# Patient Record
Sex: Male | Born: 1940 | Race: White | Hispanic: No | Marital: Married
Health system: Southern US, Community
[De-identification: ages and names within clinical notes are randomized; demographics above are authoritative.]

## PROBLEM LIST (undated history)

## (undated) DIAGNOSIS — I639 Cerebral infarction, unspecified: Secondary | ICD-10-CM

---

## 2020-04-25 ENCOUNTER — Encounter (HOSPITAL_COMMUNITY): Payer: Self-pay

## 2020-04-25 ENCOUNTER — Emergency Department (HOSPITAL_COMMUNITY): Payer: Medicare Other

## 2020-04-25 ENCOUNTER — Other Ambulatory Visit: Payer: Self-pay

## 2020-04-25 ENCOUNTER — Emergency Department (HOSPITAL_COMMUNITY)
Admission: EM | Admit: 2020-04-25 | Discharge: 2020-04-26 | Disposition: A | Discharge status: Home / Self Care | Payer: Medicare Other | Attending: Emergency Medicine | Admitting: Emergency Medicine

## 2020-04-25 DIAGNOSIS — Z8673 Personal history of transient ischemic attack (TIA), and cerebral infarction without residual deficits: Secondary | ICD-10-CM | POA: Insufficient documentation

## 2020-04-25 DIAGNOSIS — R0789 Other chest pain: Secondary | ICD-10-CM | POA: Insufficient documentation

## 2020-04-25 DIAGNOSIS — R600 Localized edema: Secondary | ICD-10-CM | POA: Diagnosis not present

## 2020-04-25 DIAGNOSIS — Z7901 Long term (current) use of anticoagulants: Secondary | ICD-10-CM | POA: Diagnosis not present

## 2020-04-25 DIAGNOSIS — R0781 Pleurodynia: Secondary | ICD-10-CM | POA: Diagnosis present

## 2020-04-25 DIAGNOSIS — R0602 Shortness of breath: Secondary | ICD-10-CM | POA: Diagnosis not present

## 2020-04-25 HISTORY — DX: Cerebral infarction, unspecified: I63.9

## 2020-04-25 LAB — CBC
HCT: 34.9 % — ABNORMAL LOW (ref 39.0–52.0)
Hemoglobin: 11.5 g/dL — ABNORMAL LOW (ref 13.0–17.0)
MCH: 33.1 pg (ref 26.0–34.0)
MCHC: 33 g/dL (ref 30.0–36.0)
MCV: 100.6 fL — ABNORMAL HIGH (ref 80.0–100.0)
Platelets: 223 10*3/uL (ref 150–400)
RBC: 3.47 MIL/uL — ABNORMAL LOW (ref 4.22–5.81)
RDW: 13.1 % (ref 11.5–15.5)
WBC: 12.8 10*3/uL — ABNORMAL HIGH (ref 4.0–10.5)
nRBC: 0 % (ref 0.0–0.2)

## 2020-04-25 LAB — BASIC METABOLIC PANEL
Anion gap: 8 (ref 5–15)
BUN: 12 mg/dL (ref 8–23)
CO2: 27 mmol/L (ref 22–32)
Calcium: 9.1 mg/dL (ref 8.9–10.3)
Chloride: 101 mmol/L (ref 98–111)
Creatinine, Ser: 0.91 mg/dL (ref 0.61–1.24)
GFR, Estimated: >60 mL/min (ref >60)
Glucose, Bld: 135 mg/dL — ABNORMAL HIGH (ref 70–99)
Potassium: 4.2 mmol/L (ref 3.5–5.1)
Sodium: 136 mmol/L (ref 135–145)

## 2020-04-25 LAB — TROPONIN I (HIGH SENSITIVITY)
Troponin I (High Sensitivity): 5 ng/L (ref <18)
Troponin I (High Sensitivity): 6 ng/L (ref <18)

## 2020-04-25 MED ORDER — IOHEXOL 350 MG/ML SOLN
78.0000 mL | Freq: Once | INTRAVENOUS | Status: AC | PRN
  Administered 2020-04-25: 78 mL via INTRAVENOUS

## 2020-04-25 NOTE — ED Provider Notes (Signed)
[facility] EMERGENCY DEPARTMENT Provider Note   CSN: [id number] Arrival date & time: 04/25/20  2010     History Chief Complaint  Patient presents with  . Chest Pain    Devin Oconnell is a 80 y.o. male history of atrial flutter on Eliquis, stroke with residual left-sided weakness, presenting to the emergency department with complaint of chest pain that began this morning.  He woke up this morning with central chest pain.  Pain is fine at rest though he has a pain across the front of his chest with any deep breathing.  He states he feels as though something is trying to "bust out" from his chest.  He is also feeling quite short of breath with any exertion though his wife admits he is not very ambulatory after his stroke. He didn't ambulate much yesterday so isn't able to tell if he was SOB yesterday.  He has been doing physical therapy, has been doing a hand bike recently.  He did his hand bike today and this did not cause any worsening of his symptoms.  It is not worse when he moves his arms, only with deep breathing.  He has not missed any doses of Eliquis.   The history is provided by the patient and the spouse.       Past Medical History:  Diagnosis Date  . Stroke [facility])     There are no problems to display for this patient.   History reviewed. No pertinent surgical history.     History reviewed. No pertinent family history.     Home Medications Prior to Admission medications   Not on File    Allergies    Penicillins  Review of Systems   Review of Systems  Respiratory: Positive for shortness of breath.   Cardiovascular: Positive for chest pain.  All other systems reviewed and are negative.   Physical Exam Updated Vital Signs BP 108/70   Pulse 77   Temp 99.5 F (37.5 C) (Oral)   Resp 20   SpO2 98%   Physical Exam Vitals and nursing note reviewed.  Constitutional:      General: He is not in acute distress.    Appearance: He is  well-developed.  HENT:     Head: Normocephalic and atraumatic.  Eyes:     Conjunctiva/sclera: Conjunctivae normal.  Cardiovascular:     Rate and Rhythm: Normal rate and regular rhythm.  Pulmonary:     Effort: Pulmonary effort is normal. No respiratory distress.     Breath sounds: Normal breath sounds.  Abdominal:     General: Bowel sounds are normal.     Palpations: Abdomen is soft.     Tenderness: There is no abdominal tenderness.  Musculoskeletal:     Right lower leg: Edema (Mild) present.     Left lower leg: Edema (Mild) present.  Skin:    General: Skin is warm.  Neurological:     Mental Status: He is alert.  Psychiatric:        Behavior: Behavior normal.     ED Results / Procedures / Treatments   Labs (all labs ordered are listed, but only abnormal results are displayed) Labs Reviewed  BASIC METABOLIC PANEL - Abnormal; Notable for the following components:      Result Value   Glucose, Bld 135 (*)    All other components within normal limits  CBC - Abnormal; Notable for the following components:   WBC 12.8 (*)    RBC 3.47 (*)  Hemoglobin 11.5 (*)    HCT 34.9 (*)    MCV 100.6 (*)    All other components within normal limits  TROPONIN I (HIGH SENSITIVITY)  TROPONIN I (HIGH SENSITIVITY)    EKG EKG Interpretation  Date/Time:  Thursday April 25 2020 20:50:49 EST Ventricular Rate:  79 PR Interval:    QRS Duration: 152 QT Interval:  553 QTC Calculation: 635 R Axis:   54 Text Interpretation: Sinus or ectopic atrial rhythm Borderline prolonged PR interval Consider left atrial enlargement Nonspecific intraventricular conduction delay No old tracing to compare Confirmed by Lorre Nick ([id number]) on 04/25/2020 10:12:35 PM   Radiology DG Chest 2 View  Result Date: 04/25/2020 CLINICAL DATA:  Chest pain EXAM: CHEST - 2 VIEW COMPARISON:  None. FINDINGS: Cardiac shadow is mildly enlarged but accentuated by the frontal technique. Lungs are well aerated. Small bilateral  pleural effusions are seen posteriorly. Degenerative changes of thoracic spine are noted. IMPRESSION: Small pleural effusions posteriorly. No other focal abnormality is noted. Electronically Signed   By: Alcide Clever M.D.   On: 04/25/2020 20:51   CT Angio Chest PE W/Cm &/Or Wo Cm  Result Date: 04/25/2020 CLINICAL DATA:  Chest pain radiating to the back EXAM: CT ANGIOGRAPHY CHEST WITH CONTRAST TECHNIQUE: Multidetector CT imaging of the chest was performed using the standard protocol during bolus administration of intravenous contrast. Multiplanar CT image reconstructions and MIPs were obtained to evaluate the vascular anatomy. CONTRAST:  52mL OMNIPAQUE IOHEXOL 350 MG/ML SOLN COMPARISON:  None. FINDINGS: Cardiovascular: Contrast injection is sufficient to demonstrate satisfactory opacification of the pulmonary arteries to the segmental level. There is no pulmonary embolus or evidence of right heart strain. The size of the main pulmonary artery is normal. Heart size is normal, with no pericardial effusion. The course and caliber of the aorta are normal. There is mild atherosclerotic calcification. Opacification decreased due to pulmonary arterial phase contrast bolus timing. Mediastinum/Nodes: No mediastinal, hilar or axillary lymphadenopathy. Normal visualized thyroid. Thoracic esophageal course is normal. Lungs/Pleura: Small left pleural effusion with basilar atelectasis. Upper Abdomen: Contrast bolus timing is not optimized for evaluation of the abdominal organs. The visualized portions of the organs of the upper abdomen are normal. Musculoskeletal: No chest wall abnormality. No bony spinal canal stenosis. Review of the MIP images confirms the above findings. IMPRESSION: 1. No pulmonary embolus. 2. Small left pleural effusion with basilar atelectasis. Aortic atherosclerosis (ICD10-I70.0). Electronically Signed   By: Deatra Kallan Bischoff M.D.   On: 04/25/2020 23:24    Procedures Procedures   Medications Ordered in  ED Medications  iohexol (OMNIPAQUE) 350 MG/ML injection 78 mL (78 mLs Intravenous Contrast Given 04/25/20 2311)    ED Course  I have reviewed the triage vital signs and the nursing notes.  Pertinent labs & imaging results that were available during my care of the patient were reviewed by me and considered in my medical decision making (see chart for details).    MDM Rules/Calculators/A&P                          Patient is 80 year old male on Eliquis for a flutter compliant, presenting to the ED for pleuritic chest pain and exertional shortness of breath that began this morning.  Patient states he was feeling fine yesterday.  He has no chest pain at rest, only when he takes a deep breath and it feels like it goes across his chest.  He has been using a hand bicycle for his  physical therapy, this is a rather new exercise.  However his pain is not reproducible on exam and presentation does sound concerning for PE.  Discussed with and evaluated by attending physician Dr. Freida Busman who agrees with CTA of the chest to evaluate for PE.  Presentation seems less likely ACS, EKG is nonischemic.  Initial troponin is negative.  PE study is negative.  No evidence of pneumonia or other acute intrathoracic pathology to account for patient's pain.  His pain seems to be musculoskeletal in nature considering recent new upper extremity exercise and description of pain.  Recommend symptomatic management with Tylenol, PCP follow-up.  Continue Eliquis.  Return if worsening.  Discussed results, findings, treatment and follow up. Patient advised of return precautions. Patient verbalized understanding and agreed with plan.   Final Clinical Impression(s) / ED Diagnoses Final diagnoses:  Chest wall pain    Rx / DC Orders ED Discharge Orders    None       Evalynne Locurto, [place] N, PA-C 04/25/20 2356    Lorre Nick, MD 04/29/20 1409

## 2020-04-25 NOTE — ED Notes (Signed)
Patient transported to X-ray 

## 2020-04-25 NOTE — ED Notes (Signed)
Patient transported to CT 

## 2020-04-25 NOTE — Discharge Instructions (Signed)
Your blood work and imaging today are very reassuring.  Please follow closely with your primary care provider.  You can treat your chest pains with Tylenol as needed. Return for any worsening or concerning symptoms.

## 2020-04-25 NOTE — ED Triage Notes (Signed)
Patient arrives from home with [facility], chest pain radiating into his back starting this morning, has been doing PT at home including hand bike, worse with inspiration, 6/10 pain, 2 naproxen earlier in the day, on Eliquis, 2L [place] started by EMS for comfort.

## 2020-04-25 NOTE — ED Provider Notes (Signed)
I provided a substantive portion of the care of this patient.  I personally performed the entirety of the medical decision making for this encounter.  EKG Interpretation  Date/Time:  Thursday April 25 2020 20:50:49 EST Ventricular Rate:  79 PR Interval:    QRS Duration: 152 QT Interval:  553 QTC Calculation: 635 R Axis:   54 Text Interpretation: Sinus or ectopic atrial rhythm Borderline prolonged PR interval Consider left atrial enlargement Nonspecific intraventricular conduction delay No old tracing to compare Confirmed by Lorre Nick ([id number]) on 04/25/2020 10:102:2 PM  80 year old male presents with pleuritic chest discomfort.  Denies any pain to palpation.  Has been doing certain exercises.  Chest x-ray without acute findings.  Low suspicion for cardiac.  Pain is pleuritic however.  Will CT chest for PE.  And likely discharge   Lorre Nick, MD 04/25/20 2246

## 2020-04-26 NOTE — ED Notes (Signed)
Pt d/c by MD and is provided w/ d/c instructions and follow up care pt out of ed with family in wheel chair

## 2022-01-18 IMAGING — CT CT ANGIO CHEST
2 of 7 series · 18 of 46 positions shown · IV contrast (omnipaque)
Comparison: None.

CLINICAL DATA: Chest pain radiating to the back

EXAM:
CT ANGIOGRAPHY CHEST WITH CONTRAST
TECHNIQUE: Multidetector CT imaging of the chest was performed using the
standard protocol during bolus administration of intravenous
contrast. Multiplanar CT image reconstructions and MIPs were
obtained to evaluate the vascular anatomy.
CONTRAST:  78mL OMNIPAQUE IOHEXOL 350 MG/ML SOLN

[Series 6: thins · axial · 0.80mm/px · z∈[+1099,+1405]mm · 15 of 342 slices shown]
[im 18/342  lung]
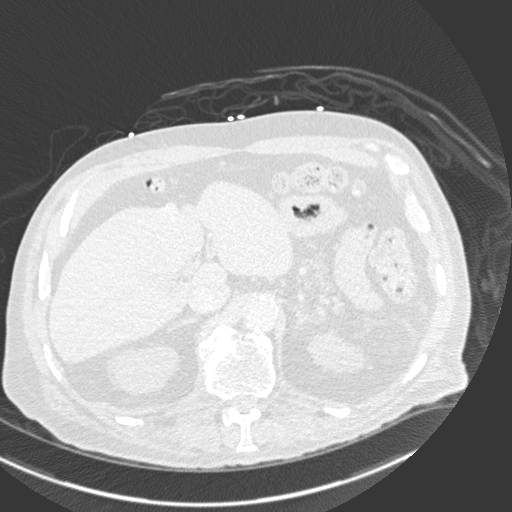
[im 36/342  soft-tissue]
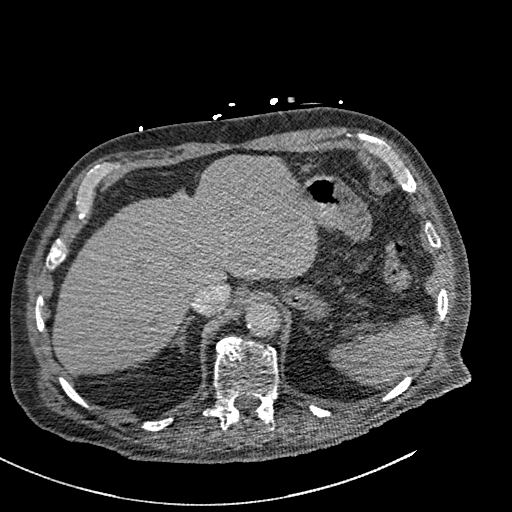
[im 72/342  lung]
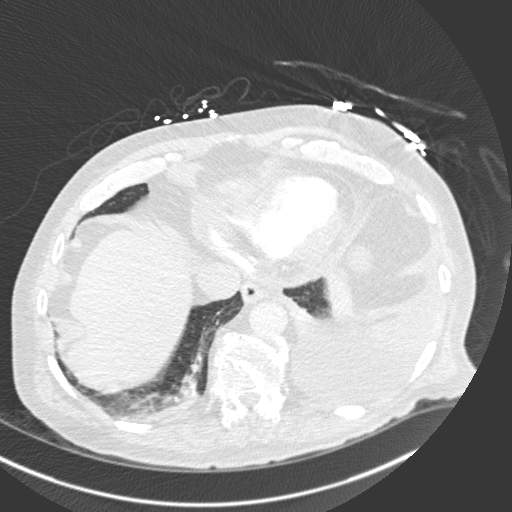
[im 90/342  soft-tissue]
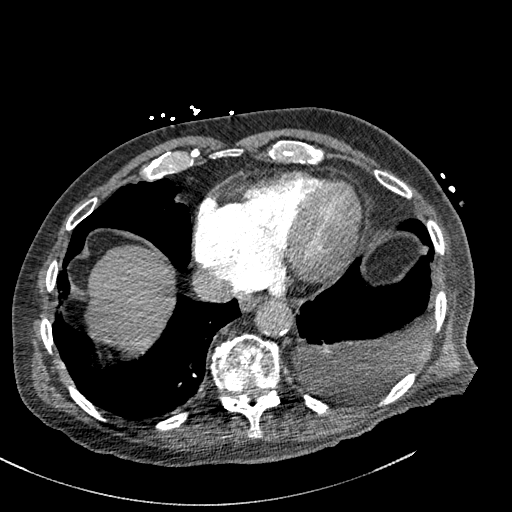
[im 108/342  lung]
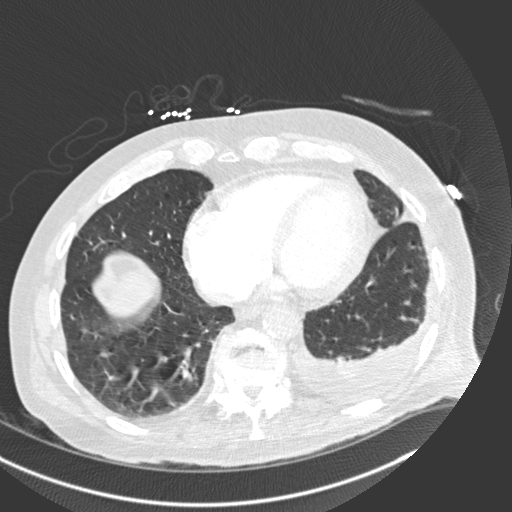
[im 126/342  soft-tissue]
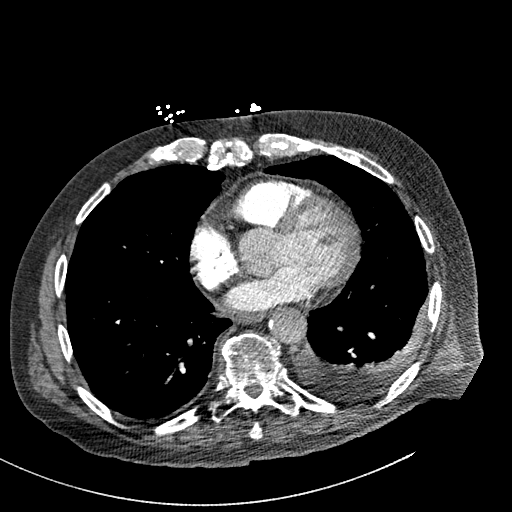
[im 144/342  lung]
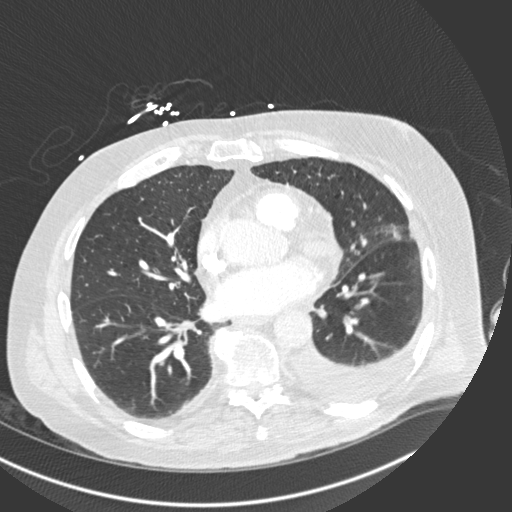
[im 180/342  soft-tissue]
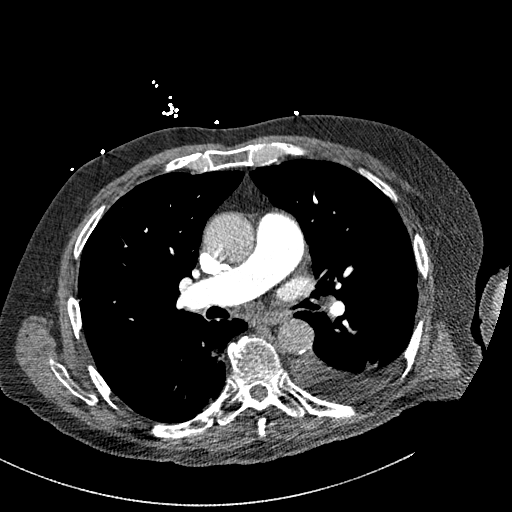
[im 198/342  lung]
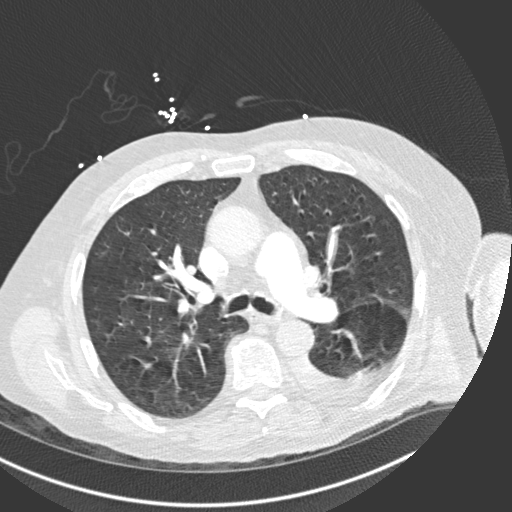
[im 216/342  soft-tissue]
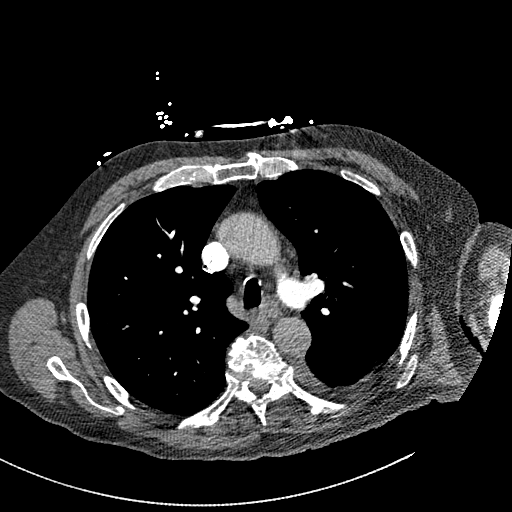
[im 234/342  lung]
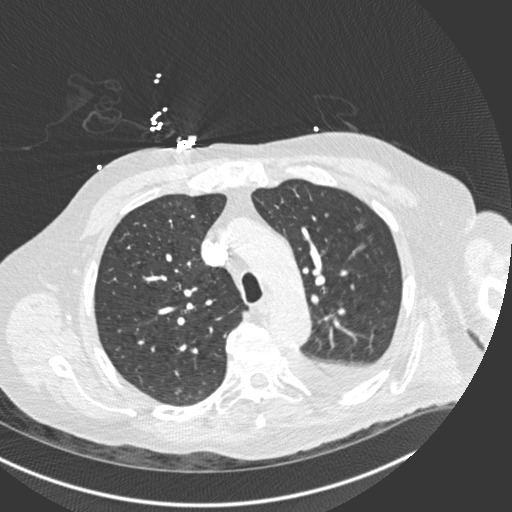
[im 252/342  soft-tissue]
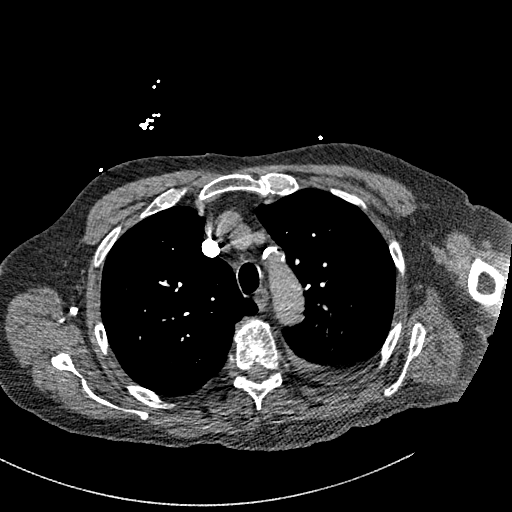
[im 288/342  lung]
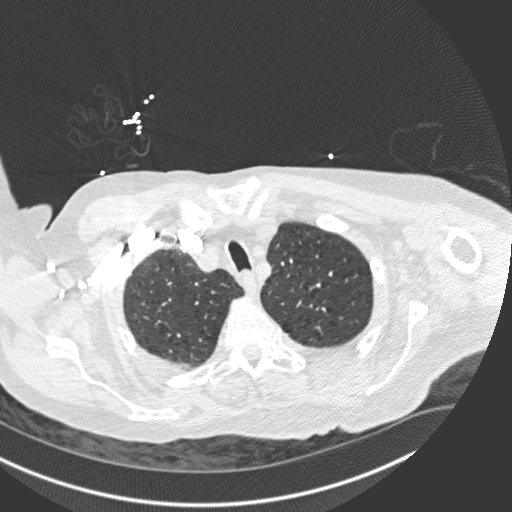
[im 306/342  soft-tissue]
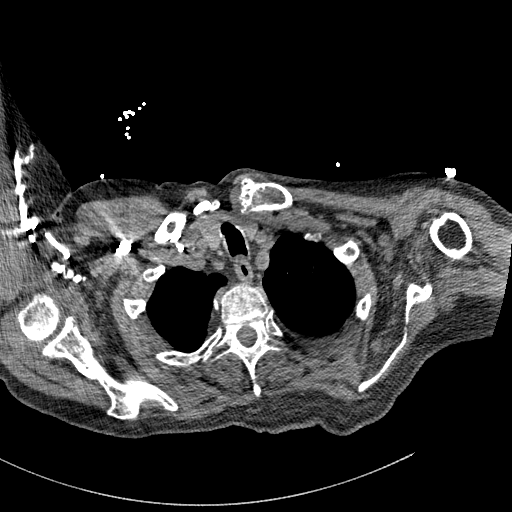
[im 324/342  lung]
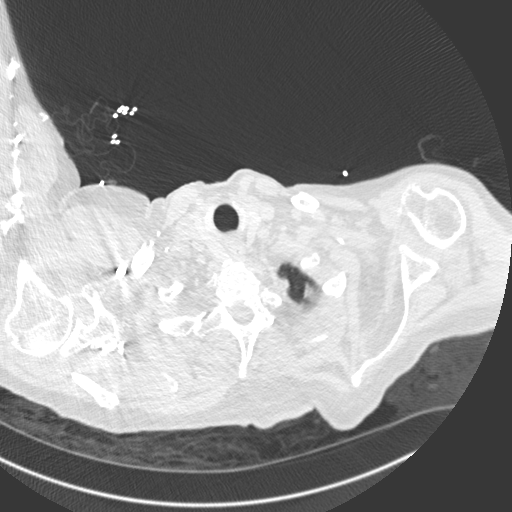

[Series 7: coronal mpr · coronal · 0.67mm/px · 3 of 151 slices shown]
[im 38/151  soft-tissue]
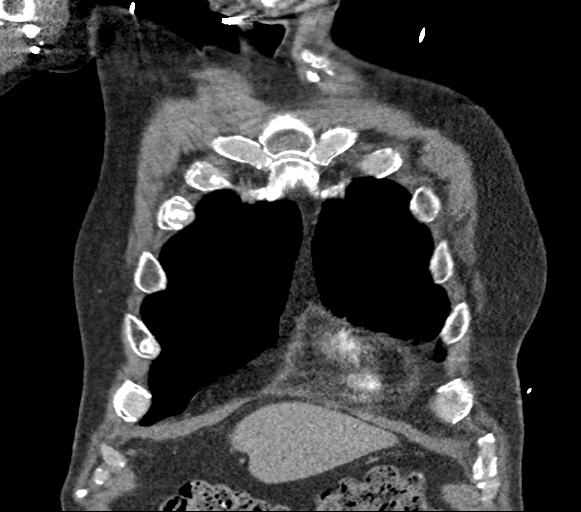
[im 76/151  soft-tissue]
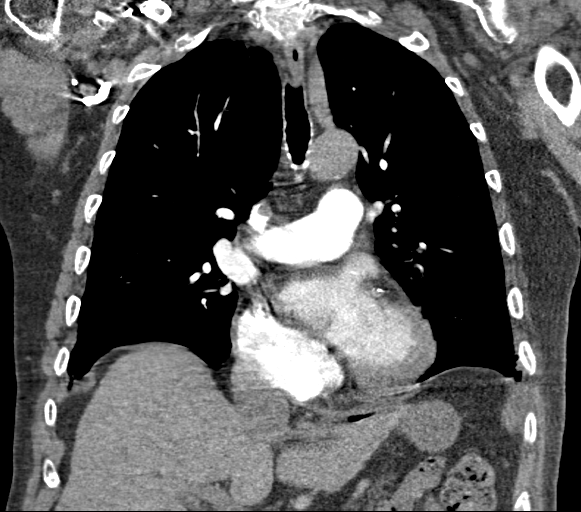
[im 113/151  soft-tissue]
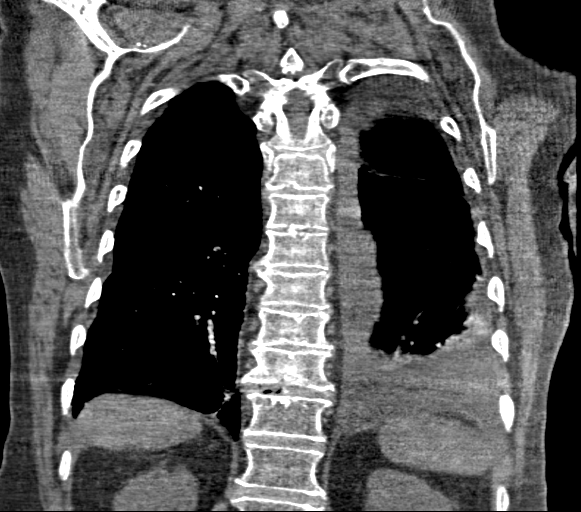

[18 of 46 positions shown; findings below may reference images not displayed]

FINDINGS: Cardiovascular: Contrast injection is sufficient to demonstrate
satisfactory opacification of the pulmonary arteries to the
segmental level. There is no pulmonary embolus or evidence of right
heart strain. The size of the main pulmonary artery is normal. Heart
size is normal, with no pericardial effusion. The course and caliber
of the aorta are normal. There is mild atherosclerotic
calcification. Opacification decreased due to pulmonary arterial
phase contrast bolus timing.

Mediastinum/Nodes: No mediastinal, hilar or axillary
lymphadenopathy. Normal visualized thyroid. Thoracic esophageal
course is normal.

Lungs/Pleura: Small left pleural effusion with basilar atelectasis.

Upper Abdomen: Contrast bolus timing is not optimized for evaluation
of the abdominal organs. The visualized portions of the organs of
the upper abdomen are normal.

Musculoskeletal: No chest wall abnormality. No bony spinal canal
stenosis.

Review of the MIP images confirms the above findings.
IMPRESSION: 1. No pulmonary embolus.
2. Small left pleural effusion with basilar atelectasis.

Aortic atherosclerosis ([id number]).
